# Patient Record
Sex: Male | Born: 1995 | Race: White | Hispanic: No | Marital: Single | Smoking: Former smoker
Health system: Southern US, Community
[De-identification: ages and names within clinical notes are randomized; demographics above are authoritative.]

## PROBLEM LIST (undated history)

## (undated) DIAGNOSIS — Z789 Other specified health status: Secondary | ICD-10-CM

## (undated) HISTORY — PX: NO PAST SURGERIES: SHX2092

---

## 2019-12-23 ENCOUNTER — Ambulatory Visit (INDEPENDENT_AMBULATORY_CARE_PROVIDER_SITE_OTHER): Payer: 59

## 2019-12-23 ENCOUNTER — Other Ambulatory Visit: Payer: Self-pay | Admitting: Podiatry

## 2019-12-23 ENCOUNTER — Other Ambulatory Visit: Payer: Self-pay

## 2019-12-23 ENCOUNTER — Ambulatory Visit: Payer: 59 | Admitting: Podiatry

## 2019-12-23 DIAGNOSIS — S92302A Fracture of unspecified metatarsal bone(s), left foot, initial encounter for closed fracture: Secondary | ICD-10-CM

## 2019-12-23 DIAGNOSIS — R600 Localized edema: Secondary | ICD-10-CM

## 2019-12-23 DIAGNOSIS — M79672 Pain in left foot: Secondary | ICD-10-CM

## 2019-12-23 DIAGNOSIS — S9782XA Crushing injury of left foot, initial encounter: Secondary | ICD-10-CM | POA: Diagnosis not present

## 2019-12-23 DIAGNOSIS — S9032XA Contusion of left foot, initial encounter: Secondary | ICD-10-CM
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
## 2019-12-24 ENCOUNTER — Encounter: Payer: Self-pay | Admitting: Podiatry

## 2019-12-24 ENCOUNTER — Telehealth: Payer: Self-pay

## 2019-12-24 DIAGNOSIS — I2699 Other pulmonary embolism without acute cor pulmonale: Secondary | ICD-10-CM | POA: Insufficient documentation

## 2019-12-24 NOTE — Telephone Encounter (Signed)
Yes please write note for pt.

## 2019-12-24 NOTE — Telephone Encounter (Signed)
Pt's mother called stating Pt was suppose to get a work note yesterday stating to be out for work for 6 weeks, if amount of time is correct please advice so we can write it for the Pt. Thanks

## 2019-12-25 NOTE — Telephone Encounter (Signed)
Work note created by Bonnetta Barry (NT), Pt states he will come in today to pick up.

## 2019-12-26 ENCOUNTER — Telehealth: Payer: Self-pay

## 2019-12-26 NOTE — Telephone Encounter (Signed)
We can move up his appt and recheck his foot
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
## 2019-12-29 ENCOUNTER — Emergency Department (HOSPITAL_COMMUNITY)
Admission: EM | Admit: 2019-12-29 | Discharge: 2019-12-30 | Disposition: A | Discharge status: Home / Self Care | Payer: 59 | Attending: Emergency Medicine | Admitting: Emergency Medicine

## 2019-12-29 ENCOUNTER — Telehealth: Payer: Self-pay | Admitting: Podiatry

## 2019-12-29 ENCOUNTER — Encounter (HOSPITAL_COMMUNITY): Payer: Self-pay

## 2019-12-29 ENCOUNTER — Other Ambulatory Visit: Payer: Self-pay

## 2019-12-29 DIAGNOSIS — I2699 Other pulmonary embolism without acute cor pulmonale: Secondary | ICD-10-CM | POA: Insufficient documentation

## 2019-12-29 DIAGNOSIS — R519 Headache, unspecified: Secondary | ICD-10-CM | POA: Diagnosis not present

## 2019-12-29 DIAGNOSIS — R0602 Shortness of breath: Secondary | ICD-10-CM | POA: Diagnosis present

## 2019-12-29 HISTORY — DX: Other specified health status: Z78.9

## 2019-12-29 LAB — BASIC METABOLIC PANEL
Anion gap: 13 (ref 5–15)
BUN: 11 mg/dL (ref 6–20)
CO2: 26 mmol/L (ref 22–32)
Calcium: 9.5 mg/dL (ref 8.9–10.3)
Chloride: 97 mmol/L — ABNORMAL LOW (ref 98–111)
Creatinine, Ser: 1.04 mg/dL (ref 0.61–1.24)
GFR calc Af Amer: >60 mL/min (ref >60)
GFR calc non Af Amer: >60 mL/min (ref >60)
Glucose, Bld: 116 mg/dL — ABNORMAL HIGH (ref 70–99)
Potassium: 4.2 mmol/L (ref 3.5–5.1)
Sodium: 136 mmol/L (ref 135–145)

## 2019-12-29 LAB — CBC
HCT: 47.1 % (ref 39.0–52.0)
Hemoglobin: 15.8 g/dL (ref 13.0–17.0)
MCH: 31.2 pg (ref 26.0–34.0)
MCHC: 33.5 g/dL (ref 30.0–36.0)
MCV: 93.1 fL (ref 80.0–100.0)
Platelets: 268 10*3/uL (ref 150–400)
RBC: 5.06 MIL/uL (ref 4.22–5.81)
RDW: 11.1 % — ABNORMAL LOW (ref 11.5–15.5)
WBC: 12.5 10*3/uL — ABNORMAL HIGH (ref 4.0–10.5)
nRBC: 0 % (ref 0.0–0.2)

## 2019-12-29 LAB — BRAIN NATRIURETIC PEPTIDE: B Natriuretic Peptide: 27.2 pg/mL (ref 0.0–100.0)

## 2019-12-29 LAB — TROPONIN I (HIGH SENSITIVITY): Troponin I (High Sensitivity): <2 ng/L (ref <18)

## 2019-12-29 MED ORDER — HYDROCODONE-ACETAMINOPHEN 5-325 MG PO TABS: 1.0000 | ORAL_TABLET | Freq: Four times a day (QID) | ORAL | 0 refills | Status: AC | PRN

## 2019-12-29 NOTE — Addendum Note (Signed)
Addended by: Ventura Sellers on: 12/29/2019 04:05 PM   Modules accepted: Orders

## 2019-12-29 NOTE — Telephone Encounter (Signed)
Pt mom called to say that pt was in pain and would like to see if Dr. Samuella Cota could call something in for pain. Pt mom states hes having a lot of pain pt also has appt set for the 21 in [place] location

## 2019-12-29 NOTE — ED Triage Notes (Addendum)
Pt states that he was dx with "blood clots" in his lungs less than a week ago. States was prescribed blood thinners but that there is a problem with his insurance and could not get it filled

## 2019-12-30 ENCOUNTER — Emergency Department (HOSPITAL_COMMUNITY): Payer: 59

## 2019-12-30 LAB — TROPONIN I (HIGH SENSITIVITY): Troponin I (High Sensitivity): <2 ng/L (ref <18)

## 2019-12-30 MED ORDER — RIVAROXABAN 15 MG PO TABS
15.0000 mg | ORAL_TABLET | Freq: Once | ORAL | Status: AC
  Administered 2019-12-30: 05:00:00 15 mg via ORAL
  Filled 2019-12-30: qty 1

## 2019-12-30 NOTE — ED Notes (Signed)
Pt verbalized understanding of d/c instructions, follow up care and s/s requiring return to ED. Also discussed need for case manager follow up and medication regime. Pt had no further questions at this time.
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
## 2019-12-30 NOTE — Discharge Instructions (Addendum)
You need to be treated for your pulmonary embolism with a blood thinner.  You are given the first dose this morning.  I have contacted our case manager/social worker, who should contact you later today and should be able to help you get your medication.  If you do not hear from them by lunchtime, please call the emergency department at [phone], and asked to speak with the case manager.  If your symptoms change or worsen please return to the emergency department.  The CT scan of your head did not show any acute abnormality.

## 2019-12-31 ENCOUNTER — Other Ambulatory Visit: Payer: Self-pay | Admitting: Podiatry

## 2019-12-31 DIAGNOSIS — S92302A Fracture of unspecified metatarsal bone(s), left foot, initial encounter for closed fracture: Secondary | ICD-10-CM

## 2020-01-01 ENCOUNTER — Encounter (HOSPITAL_COMMUNITY): Payer: Self-pay | Admitting: Emergency Medicine

## 2020-01-01 ENCOUNTER — Other Ambulatory Visit: Payer: Self-pay

## 2020-01-01 ENCOUNTER — Emergency Department (HOSPITAL_COMMUNITY)
Admission: EM | Admit: 2020-01-01 | Discharge: 2020-01-01 | Disposition: A | Discharge status: Home / Self Care | Payer: 59 | Attending: Emergency Medicine | Admitting: Emergency Medicine

## 2020-01-01 ENCOUNTER — Ambulatory Visit: Payer: 59 | Admitting: Podiatry

## 2020-01-01 ENCOUNTER — Emergency Department (HOSPITAL_COMMUNITY): Payer: 59

## 2020-01-01 ENCOUNTER — Emergency Department (HOSPITAL_BASED_OUTPATIENT_CLINIC_OR_DEPARTMENT_OTHER): Payer: 59

## 2020-01-01 VITALS — BP 105/87 | HR 91 | Temp 98.2°F

## 2020-01-01 DIAGNOSIS — I8290 Acute embolism and thrombosis of unspecified vein: Secondary | ICD-10-CM | POA: Diagnosis not present

## 2020-01-01 DIAGNOSIS — I82412 Acute embolism and thrombosis of left femoral vein: Secondary | ICD-10-CM | POA: Diagnosis not present

## 2020-01-01 DIAGNOSIS — I2699 Other pulmonary embolism without acute cor pulmonale: Secondary | ICD-10-CM | POA: Insufficient documentation

## 2020-01-01 DIAGNOSIS — M79609 Pain in unspecified limb: Secondary | ICD-10-CM | POA: Diagnosis not present

## 2020-01-01 DIAGNOSIS — Z87891 Personal history of nicotine dependence: Secondary | ICD-10-CM | POA: Diagnosis not present

## 2020-01-01 DIAGNOSIS — M79605 Pain in left leg: Secondary | ICD-10-CM | POA: Diagnosis present

## 2020-01-01 DIAGNOSIS — I824Y2 Acute embolism and thrombosis of unspecified deep veins of left proximal lower extremity: Secondary | ICD-10-CM

## 2020-01-01 DIAGNOSIS — R0602 Shortness of breath: Secondary | ICD-10-CM | POA: Insufficient documentation

## 2020-01-01 LAB — BASIC METABOLIC PANEL
Anion gap: 13 (ref 5–15)
BUN: 14 mg/dL (ref 6–20)
CO2: 25 mmol/L (ref 22–32)
Calcium: 9.4 mg/dL (ref 8.9–10.3)
Chloride: 99 mmol/L (ref 98–111)
Creatinine, Ser: 0.97 mg/dL (ref 0.61–1.24)
GFR calc Af Amer: >60 mL/min (ref >60)
GFR calc non Af Amer: >60 mL/min (ref >60)
Glucose, Bld: 110 mg/dL — ABNORMAL HIGH (ref 70–99)
Potassium: 4.4 mmol/L (ref 3.5–5.1)
Sodium: 137 mmol/L (ref 135–145)

## 2020-01-01 LAB — CBC
HCT: 46.3 % (ref 39.0–52.0)
Hemoglobin: 15.3 g/dL (ref 13.0–17.0)
MCH: 30.8 pg (ref 26.0–34.0)
MCHC: 33 g/dL (ref 30.0–36.0)
MCV: 93.3 fL (ref 80.0–100.0)
Platelets: 357 10*3/uL (ref 150–400)
RBC: 4.96 MIL/uL (ref 4.22–5.81)
RDW: 11.2 % — ABNORMAL LOW (ref 11.5–15.5)
WBC: 11.6 10*3/uL — ABNORMAL HIGH (ref 4.0–10.5)
nRBC: 0 % (ref 0.0–0.2)

## 2020-01-01 MED ORDER — LORAZEPAM 0.5 MG PO TABS
0.5000 mg | ORAL_TABLET | Freq: Once | ORAL | Status: AC
  Administered 2020-01-01: 0.5 mg via ORAL
  Filled 2020-01-01: qty 1

## 2020-01-01 MED ORDER — SODIUM CHLORIDE 0.9% FLUSH: 3.0000 mL | Freq: Once | INTRAVENOUS | Status: DC

## 2020-01-01 MED ORDER — SODIUM CHLORIDE 0.9 % IV BOLUS
1000.0000 mL | Freq: Once | INTRAVENOUS | Status: AC
  Administered 2020-01-01: 1000 mL via INTRAVENOUS

## 2020-01-01 MED ORDER — IOHEXOL 350 MG/ML SOLN
100.0000 mL | Freq: Once | INTRAVENOUS | Status: AC | PRN
  Administered 2020-01-01: 100 mL via INTRAVENOUS
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
## 2020-01-01 NOTE — ED Notes (Signed)
Patient taken to CT.

## 2020-01-01 NOTE — Discharge Instructions (Signed)
Please take your Xarelto as prescribed.  Your CT scan today showed improvement in your pulmonary embolisms.  Please monitor your symptoms.  Please continue to gently exercise.  Stay hydrated.  Please stand up and walk

## 2020-01-01 NOTE — Progress Notes (Signed)
Left lower extremity venous duplex completed. Refer to "CV Proc" under chart review to view preliminary results.  Critical results discussed with Stevphen Meuse, PA-C.  01/01/2020 4:53 PM Eula Fried., MHA, RVT, RDCS, RDMS

## 2020-01-12 ENCOUNTER — Ambulatory Visit: Payer: 59 | Admitting: Podiatry

## 2020-01-12 ENCOUNTER — Encounter: Payer: Self-pay | Admitting: Podiatry

## 2020-01-12 ENCOUNTER — Other Ambulatory Visit: Payer: Self-pay

## 2020-01-12 DIAGNOSIS — S301XXA Contusion of abdominal wall, initial encounter: Secondary | ICD-10-CM | POA: Insufficient documentation

## 2020-01-12 DIAGNOSIS — I824Y2 Acute embolism and thrombosis of unspecified deep veins of left proximal lower extremity: Secondary | ICD-10-CM | POA: Diagnosis not present

## 2020-01-12 DIAGNOSIS — S20219A Contusion of unspecified front wall of thorax, initial encounter: Secondary | ICD-10-CM | POA: Insufficient documentation

## 2020-01-12 NOTE — Progress Notes (Signed)
  Subjective:  Patient ID: Anthony Sanders, male    DOB: Jan 23, 1996,  MRN: [id number]  Chief Complaint  Patient presents with  . Foot Injury    F/U Lt fx Pt. states,' last night it really swelled up, but it's deffinetly betten than wha it has been, it's been feeling better; 5/10 pccasopma; spress -still a little sensitive Tx: boot, xarelto and hydro as needes -wlaking better -xarelto helping with pain    24 y.o. male presents with the above complaint. History confirmed with patient.  Objective:  Physical Exam: warm, good capillary refill, no trophic changes or ulcerative lesions, normal DP and PT pulses and normal sensory exam. Left foot: POP left 4th metatarsal fracture. Reduced pain left thigh.  Assessment:   1. Acute deep vein thrombosis (DVT) of proximal vein of left lower extremity (HCC)    Plan:  Patient was evaluated and treated and all questions answered.  Left 4th metatarsal fracture -Continue WBAT, use crutches if needed.  -F/u in 2 weeks for new XRs.  DVT F[facility]u [facility] reviewed. -Improving, continue Xarelto -Pt has appt with hematology  Return in about 2 weeks (around 01/26/2020) for Fracture f/u with XRs.

## 2020-01-13 DIAGNOSIS — I2699 Other pulmonary embolism without acute cor pulmonale: Secondary | ICD-10-CM

## 2020-01-20 ENCOUNTER — Ambulatory Visit: Payer: 59 | Admitting: Podiatry

## 2020-01-27 ENCOUNTER — Ambulatory Visit: Payer: 59 | Admitting: Podiatry

## 2020-02-02 ENCOUNTER — Other Ambulatory Visit: Payer: Self-pay

## 2020-02-02 ENCOUNTER — Other Ambulatory Visit: Payer: Self-pay | Admitting: Podiatry

## 2020-02-02 ENCOUNTER — Encounter: Payer: Self-pay | Admitting: Podiatry

## 2020-02-02 ENCOUNTER — Ambulatory Visit (INDEPENDENT_AMBULATORY_CARE_PROVIDER_SITE_OTHER): Payer: 59

## 2020-02-02 ENCOUNTER — Ambulatory Visit: Payer: 59 | Admitting: Podiatry

## 2020-02-02 DIAGNOSIS — I2699 Other pulmonary embolism without acute cor pulmonale: Secondary | ICD-10-CM

## 2020-02-02 DIAGNOSIS — Z7901 Long term (current) use of anticoagulants: Secondary | ICD-10-CM | POA: Insufficient documentation

## 2020-02-02 DIAGNOSIS — F1010 Alcohol abuse, uncomplicated: Secondary | ICD-10-CM | POA: Insufficient documentation

## 2020-02-02 DIAGNOSIS — R7989 Other specified abnormal findings of blood chemistry: Secondary | ICD-10-CM | POA: Insufficient documentation

## 2020-02-02 DIAGNOSIS — S92302D Fracture of unspecified metatarsal bone(s), left foot, subsequent encounter for fracture with routine healing: Secondary | ICD-10-CM

## 2020-02-02 DIAGNOSIS — F17210 Nicotine dependence, cigarettes, uncomplicated: Secondary | ICD-10-CM | POA: Insufficient documentation

## 2020-02-02 DIAGNOSIS — S92302A Fracture of unspecified metatarsal bone(s), left foot, initial encounter for closed fracture: Secondary | ICD-10-CM | POA: Diagnosis not present

## 2020-02-02 DIAGNOSIS — M79672 Pain in left foot: Secondary | ICD-10-CM

## 2020-02-02 DIAGNOSIS — I824Y2 Acute embolism and thrombosis of unspecified deep veins of left proximal lower extremity: Secondary | ICD-10-CM

## 2020-02-02 NOTE — Progress Notes (Signed)
  Subjective:  Patient ID: Anthony Sanders, male    DOB: 06/20/1996,  MRN: [id number]  Chief Complaint  Patient presents with  . Fracture    F/U Lt 4th toe fx Pt. states," it stings at times and I can't really put much weight on it, it looks kind of sweollen; 5/10 pains." Tx: xarelto -wrose with prolong standing/walking    24 y.o. male presents with the above complaint. History confirmed with patient.  Objective:  Physical Exam: warm, good capillary refill, no trophic changes or ulcerative lesions, normal DP and PT pulses and normal sensory exam. Left foot: slight POP 4th metatarsal shaft and neck  Radiographs: taken and reviewed fracture appears fully healed. Adequate length  Assessment:   1. Closed fracture of metatarsal neck, left, with routine healing, subsequent encounter   2. Acute pulmonary embolism, unspecified pulmonary embolism type, unspecified whether acute cor pulmonale present (HCC)   3. Acute deep vein thrombosis (DVT) of proximal vein of left lower extremity (HCC)    Plan:  Patient was evaluated and treated and all questions answered.  Left 4th metatarsal fracture -Appears healed on XRs -Dispense surgical shoe. -WB in surgical shoe until pain resolves.  DVT F/u -Had appt with hematology, no genetic abnormality suspected. -Recommended PCP follow-up for respiratory systems. Patient describes feeling winded with some activity.  Return in about 4 weeks (around 03/01/2020) for Fracture f/u with XRs.

## 2020-02-09 ENCOUNTER — Other Ambulatory Visit: Payer: Self-pay | Admitting: Podiatry

## 2020-02-09 DIAGNOSIS — S92302D Fracture of unspecified metatarsal bone(s), left foot, subsequent encounter for fracture with routine healing: Secondary | ICD-10-CM

## 2020-02-27 ENCOUNTER — Other Ambulatory Visit: Payer: Self-pay

## 2020-03-01 ENCOUNTER — Other Ambulatory Visit: Payer: Self-pay | Admitting: Podiatry

## 2020-03-01 ENCOUNTER — Ambulatory Visit (INDEPENDENT_AMBULATORY_CARE_PROVIDER_SITE_OTHER): Payer: 59 | Admitting: Podiatry

## 2020-03-01 DIAGNOSIS — Z5329 Procedure and treatment not carried out because of patient's decision for other reasons: Secondary | ICD-10-CM

## 2020-03-01 DIAGNOSIS — M79672 Pain in left foot: Secondary | ICD-10-CM

## 2020-03-01 NOTE — Progress Notes (Signed)
No show for appt. 

## 2020-03-02 ENCOUNTER — Other Ambulatory Visit: Payer: Self-pay

## 2020-03-02 ENCOUNTER — Ambulatory Visit: Payer: 59 | Admitting: Podiatry

## 2020-03-02 ENCOUNTER — Ambulatory Visit (INDEPENDENT_AMBULATORY_CARE_PROVIDER_SITE_OTHER): Payer: 59

## 2020-03-02 DIAGNOSIS — G5762 Lesion of plantar nerve, left lower limb: Secondary | ICD-10-CM

## 2020-03-02 DIAGNOSIS — M79672 Pain in left foot: Secondary | ICD-10-CM

## 2020-03-02 NOTE — Progress Notes (Signed)
  Subjective:  Patient ID: Anthony Sanders, male    DOB: August 14, 1996,  MRN: [id number]  Chief Complaint  Patient presents with  . Foot Injury    F/U Lt fx Pt. states," Still gets swollen sometimes and still hurts when I try to move it a certain times. sometime sit feel good than I move my toes and pain comes back; 5/10." Tx: sx shoe   24 y.o. male presents with the above complaint. History confirmed with patient. Thinks the pain is more between his toes. Worst when curling his toes.  Objective:  Physical Exam: warm, good capillary refill, no trophic changes or ulcerative lesions, normal DP and PT pulses and normal sensory exam. Left Foot: tenderness between the 3rd and 4th metatarsal head, no pain at 4th met head or 4th met neck   Assessment:   1. Morton neuroma, left    Plan:  Patient was evaluated and treated and all questions answered.  Morton Neuroma, Hx 4th Metatarsal Fracture -The pain today seems to be isolated more to the interspace. He does not have pain on the metatarsal area itself. He feels like he can localize the pain to the interspace as well. -The bone is healed sufficiently for injection therapy for the likely neuroma. -This neuroma is likely related to the initial injury or the recovery of this injury.  Procedure: Neuroma Injection Location: Left 3rd interspace Skin Prep: Alcohol. Injectate: 0.5 cc 0.5% marcaine plain, 0.5 cc celestone phosphate. Disposition: Patient tolerated procedure well. Injection site dressed with a band-aid  Return in about 3 weeks (around 03/23/2020) for Neuroma.

## 2020-03-04 ENCOUNTER — Telehealth: Payer: Self-pay | Admitting: Urology

## 2020-03-04 ENCOUNTER — Encounter: Payer: Self-pay | Admitting: Podiatry

## 2020-03-04 NOTE — Telephone Encounter (Signed)
Ok. Thank you.

## 2020-03-04 NOTE — Telephone Encounter (Signed)
If he feels comfortable I can clear him to go back and we can write the note

## 2020-03-04 NOTE — Telephone Encounter (Signed)
Pt called saying he needs a note for work. Is he cleared to go back to work or does he need to stay out of work, if so for how long? He said he wasn't sure what you said for him to do.

## 2020-03-05 ENCOUNTER — Other Ambulatory Visit: Payer: Self-pay | Admitting: Podiatry

## 2020-03-05 DIAGNOSIS — G5762 Lesion of plantar nerve, left lower limb: Secondary | ICD-10-CM

## 2020-03-22 ENCOUNTER — Ambulatory Visit: Payer: 59 | Admitting: Podiatry

## 2020-04-19 ENCOUNTER — Other Ambulatory Visit: Payer: Self-pay

## 2020-04-19 ENCOUNTER — Ambulatory Visit: Payer: 59 | Admitting: Podiatry

## 2020-04-19 DIAGNOSIS — G5782 Other specified mononeuropathies of left lower limb: Secondary | ICD-10-CM | POA: Diagnosis not present

## 2020-04-19 NOTE — Progress Notes (Signed)
  Subjective:  Patient ID: Anthony Sanders, male    DOB: 09/22/1996,  MRN: [id number]  Chief Complaint  Patient presents with  . Neuroma    F/U Lt neuroma Pt. states," it's off and on, some days it's buring some days I can feel the veins." -w/ numbness,tingling and swellgin EX:BMWU    24 y.o. male presents with the above complaint. History confirmed with patient.   Objective:  Physical Exam: warm, good capillary refill, no trophic changes or ulcerative lesions, normal DP and PT pulses and normal sensory exam. Left Foot: tenderness between the 2nd and 3rd metatarsal head   Assessment:   1. Interdigital neuroma of left foot    Plan:  Patient was evaluated and treated and all questions answered.  Morton Neuroma -Injection delivered to the affected interspaces  Procedure: Neuroma Injection Location: Left 2nd interspace Skin Prep: Alcohol. Injectate: 0.5 cc 0.5% marcaine plain, 0.5 cc dexamethasone phosphate. Disposition: Patient tolerated procedure well. Injection site dressed with a band-aid.  Return in about 3 weeks (around 05/10/2020) for Neuroma.

## 2020-05-17 ENCOUNTER — Ambulatory Visit: Payer: 59 | Admitting: Podiatry

## 2020-05-17 ENCOUNTER — Other Ambulatory Visit: Payer: Self-pay

## 2020-05-17 DIAGNOSIS — G5782 Other specified mononeuropathies of left lower limb: Secondary | ICD-10-CM | POA: Diagnosis not present

## 2020-05-17 NOTE — Progress Notes (Signed)
  Subjective:  Patient ID: Anthony Sanders, male    DOB: 12/22/1995,  MRN: [id number]  Chief Complaint  Patient presents with  . Neuroma    F/U Lt neuroma Pt. state,s" about the same, or alittle bit better some days -w/ little irritation and burning and itching; 2-3/10.": tx: none    24 y.o. male presents with the above complaint. History confirmed with patient.   Objective:  Physical Exam: warm, good capillary refill, no trophic changes or ulcerative lesions, normal DP and PT pulses and normal sensory exam. Left Foot: tenderness between the 3rd and 4th metatarsal head, none between 2nd/3rd.  Assessment:   1. Interdigital neuroma of left foot    Plan:  Patient was evaluated and treated and all questions answered.  Morton Neuroma -Injection delivered to the affected interspaces  Procedure: Neuroma Injection Location: Left 3rd interspace Skin Prep: Alcohol. Injectate: 0.5 cc 0.5% marcaine plain, 0.5 cc dexamethasone phosphate. Disposition: Patient tolerated procedure well. Injection site dressed with a band-aid.   Return if symptoms worsen or fail to improve.

## 2020-10-06 IMAGING — CT CT ANGIO CHEST
2 of 6 series · 17 of 46 positions shown · IV contrast (omnipaque)
Comparison: CT 12/25/2019

CLINICAL DATA: Shortness of breath

EXAM:
CT ANGIOGRAPHY CHEST WITH CONTRAST
TECHNIQUE: Multidetector CT imaging of the chest was performed using the
standard protocol during bolus administration of intravenous
contrast. Multiplanar CT image reconstructions and MIPs were
obtained to evaluate the vascular anatomy.
CONTRAST:  100mL OMNIPAQUE IOHEXOL 350 MG/ML SOLN

[Series 6: thins · axial · 0.84mm/px · z∈[+1224,+1449]mm · 14 of 247 slices shown]
[im 11/247  lung]
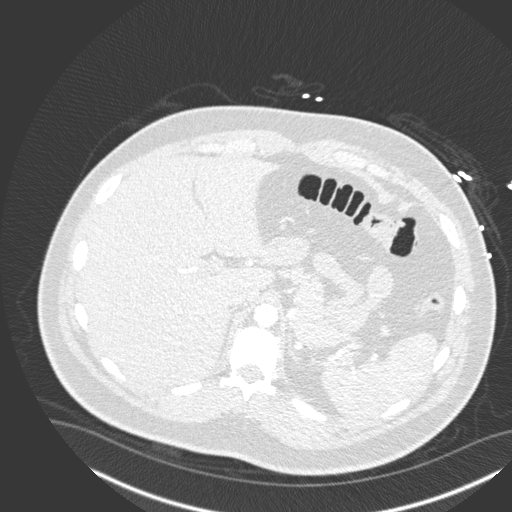
[im 33/247  soft-tissue]
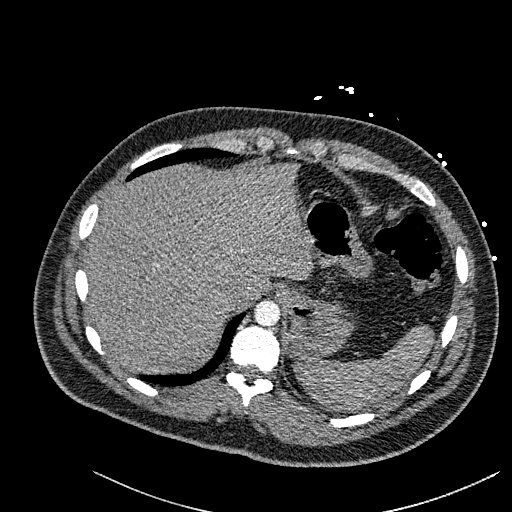
[im 43/247  lung]
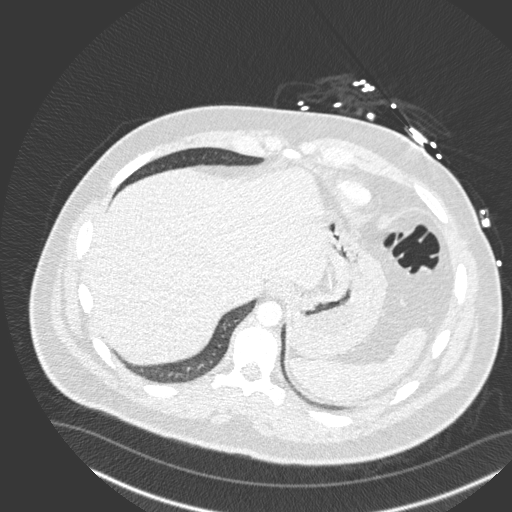
[im 65/247  soft-tissue]
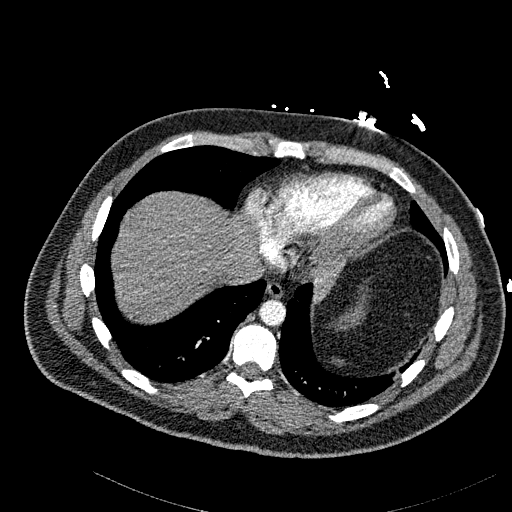
[im 86/247  lung]
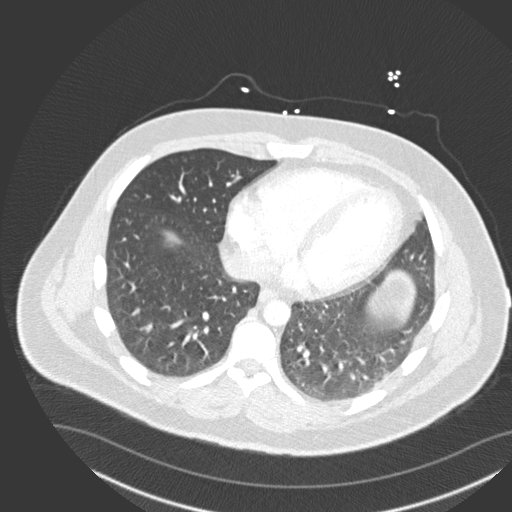
[im 97/247  soft-tissue]
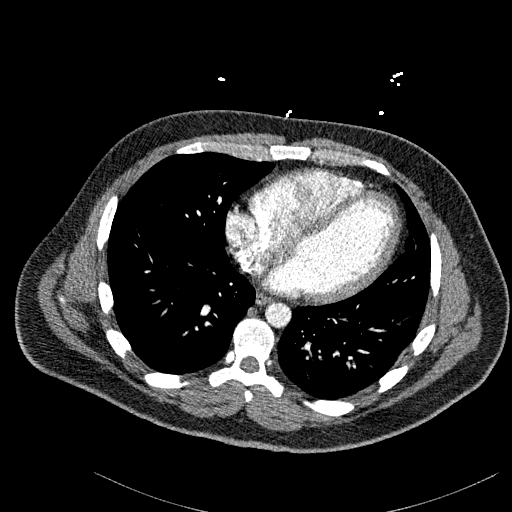
[im 118/247  lung]
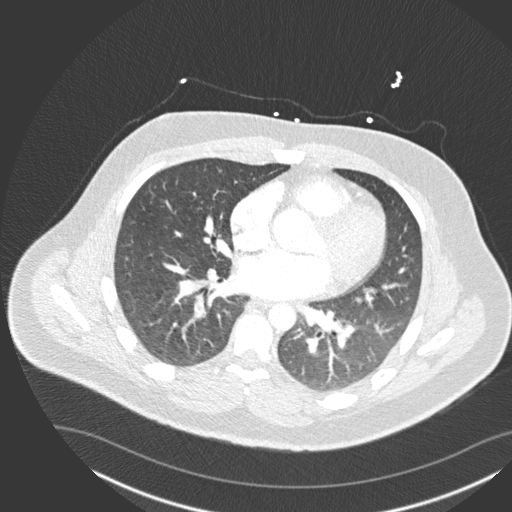
[im 129/247  soft-tissue]
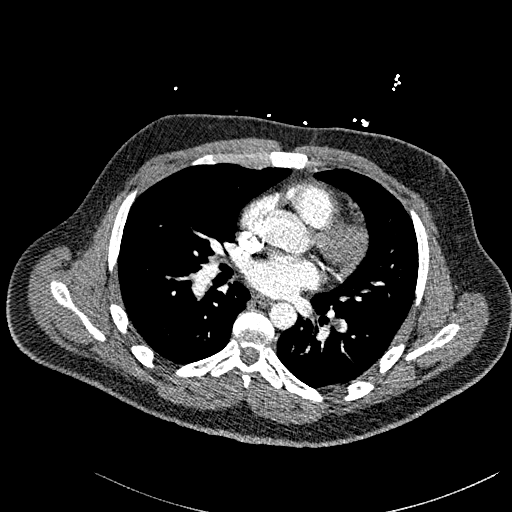
[im 150/247  lung]
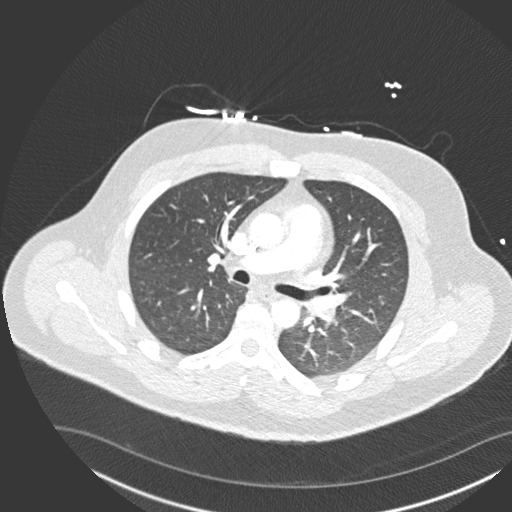
[im 161/247  soft-tissue]
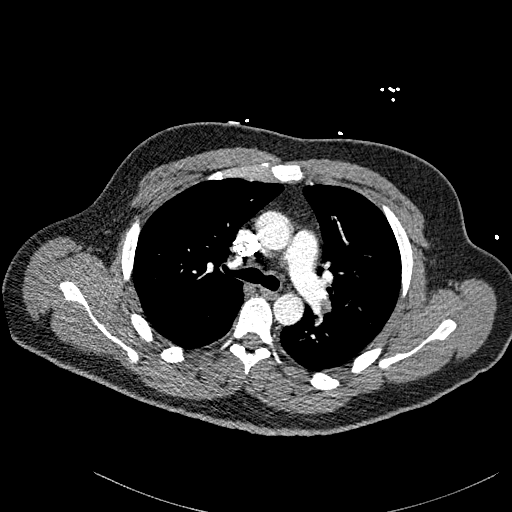
[im 182/247  lung]
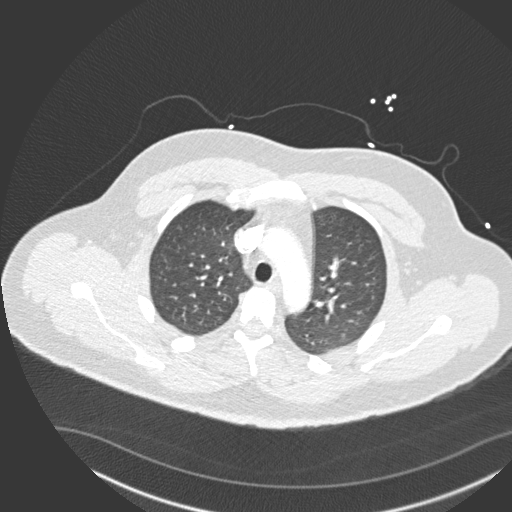
[im 204/247  soft-tissue]
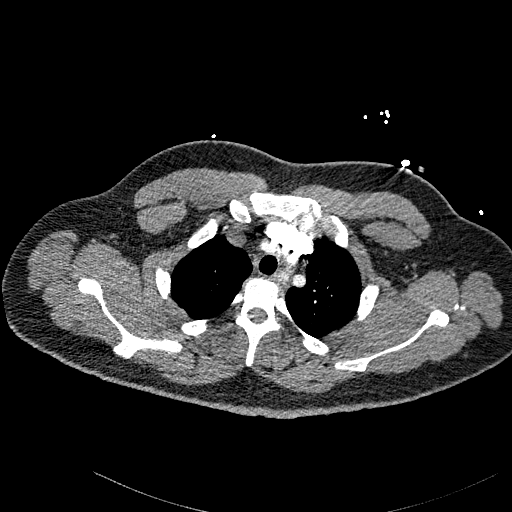
[im 214/247  lung]
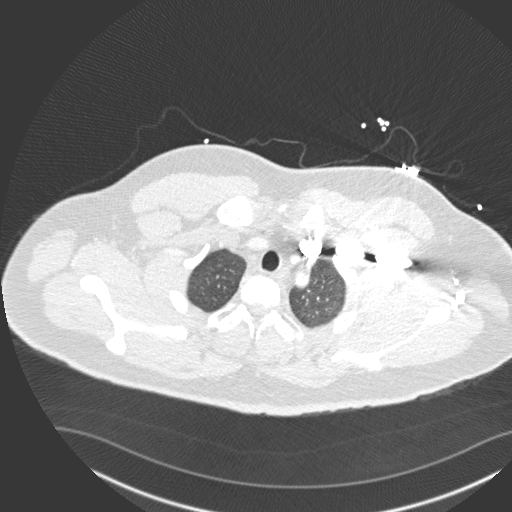
[im 236/247  soft-tissue]
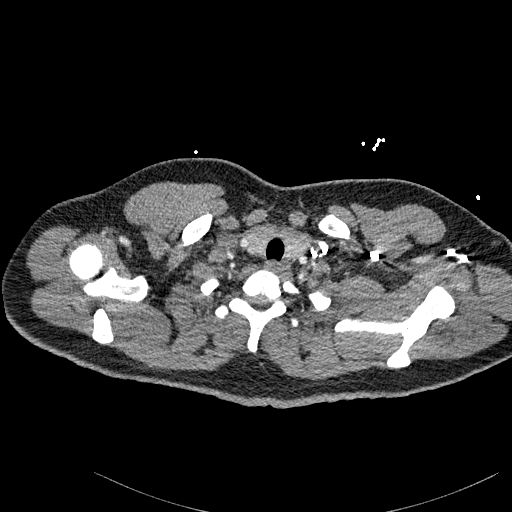

[Series 8: coronal mpr · coronal · 0.51mm/px · 3 of 157 slices shown]
[im 40/157  soft-tissue]
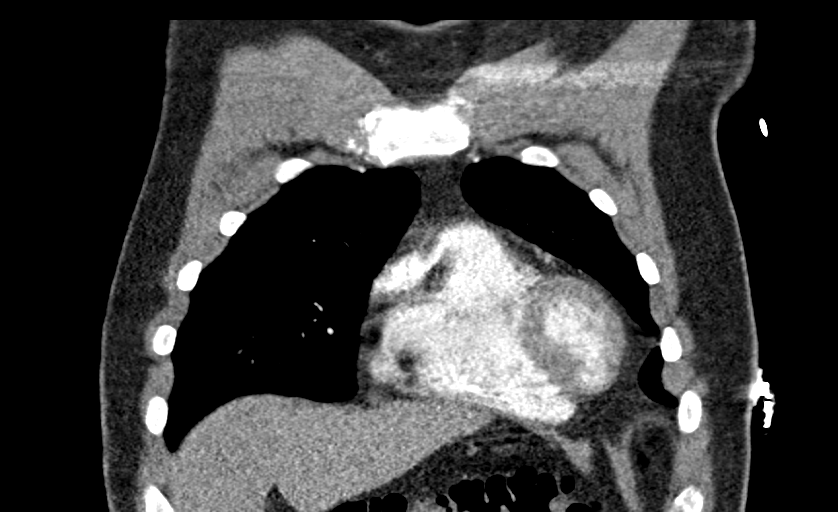
[im 79/157  soft-tissue]
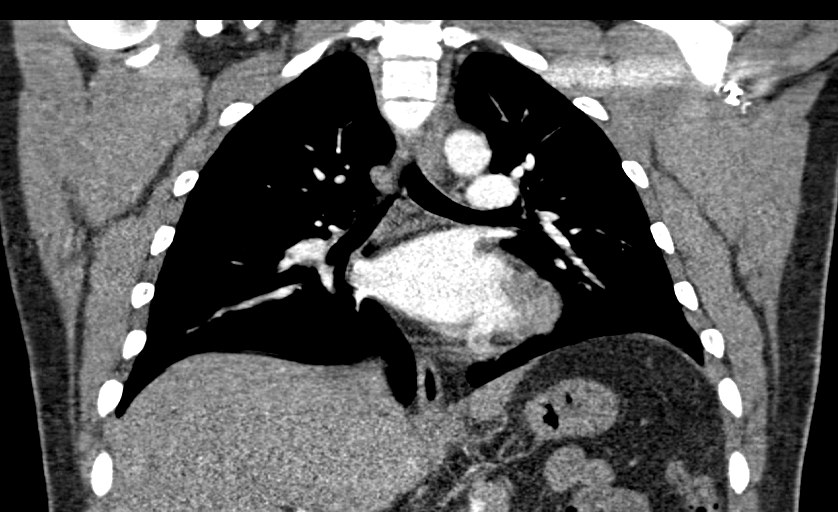
[im 118/157  soft-tissue]
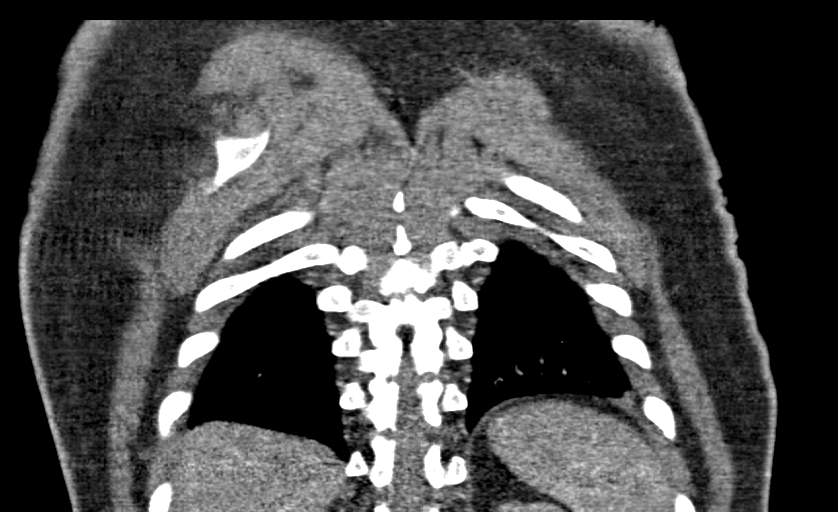

[17 of 46 positions shown; findings below may reference images not displayed]

FINDINGS: Cardiovascular: Satisfactory opacification of the pulmonary arteries
to the segmental level. Known bilateral pulmonary emboli. Small
residual thrombus within right upper lobar and segmental branch
vessels. Thrombus within the right lower lobar and segmental branch
vessels, overall decreased clot burden. Small amount of residual
saddle thrombus on the left with thrombus present in the descending
left pulmonary artery and extending into left lower lobe segmental
branch vessels. Overall clot burden appears decreased, with
resolution of left lower lobe subsegmental emboli. No new embolus is
visualized. Nonaneurysmal aorta. No dissection. Normal heart size.
No pericardial effusion.

Mediastinum/Nodes: No enlarged mediastinal, hilar, or axillary lymph
nodes. Thyroid gland, trachea, and esophagus demonstrate no
significant findings.

Lungs/Pleura: Lungs are clear. No pleural effusion or pneumothorax.

Upper Abdomen: No acute abnormality.

Musculoskeletal: No chest wall abnormality. No acute or significant
osseous findings.

Review of the MIP images confirms the above findings.
IMPRESSION: 1. Known bilateral pulmonary emboli with overall decreased clot
burden since 12/25/2019 chest CT. No new acute emboli are
visualized.
2. Clear lung fields
